# Patient Record
Sex: Male | Born: 1935 | Race: Black or African American | Hispanic: No | Marital: Married | Smoking: Never smoker
Health system: Southern US, Community
[De-identification: ages and names within clinical notes are randomized; demographics above are authoritative.]

---

## 2007-06-11 ENCOUNTER — Other Ambulatory Visit: Payer: Self-pay

## 2007-06-11 ENCOUNTER — Emergency Department: Payer: Self-pay | Admitting: Emergency Medicine

## 2008-10-03 IMAGING — NM NM LUNG SCAN
2 series · 16 of 16 positions shown · non-contrast
Comparison: none

REASON FOR EXAM: + d dimer
COMMENTS:

[Series 1000: lung perfusion · 1.95mm/px · 4 acquisitions, 8 frames shown]
[im 1/4]
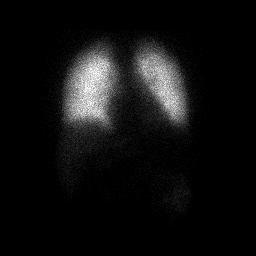
[im 1/4]
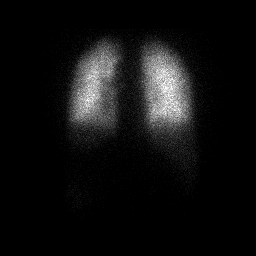
[im 2/4]
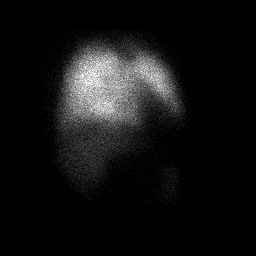
[im 2/4]
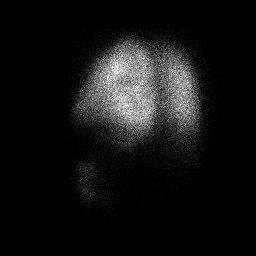
[im 3/4]
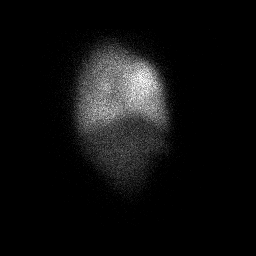
[im 3/4]
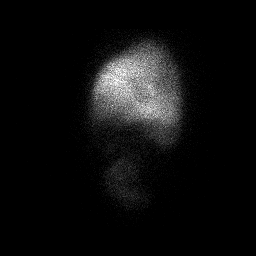
[im 4/4]
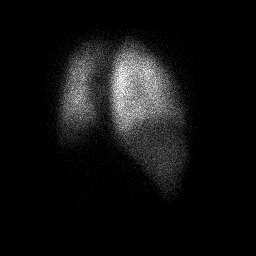
[im 4/4]
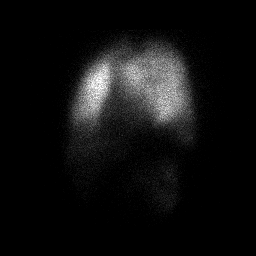

[Series 1000: lung ventilation · 3.90mm/px · 4 acquisitions, 8 frames shown]
[im 1/4]
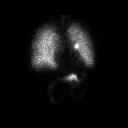
[im 1/4]
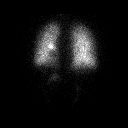
[im 2/4]
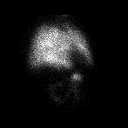
[im 2/4]
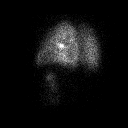
[im 3/4]
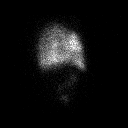
[im 3/4]
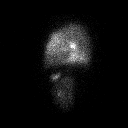
[im 4/4]
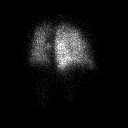
[im 4/4]
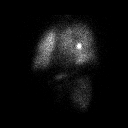

[16 of 16 positions shown; findings below may reference images not displayed]

PROCEDURE:     NM  - NM VQ LUNG SCAN  - [DATE]  [DATE] [DATE]  [DATE]

RESULT:     Comparison: Chest radiographs on 06/11/2007.

Procedure:
Patient inhaled aerosolized Tc 99m DTPA from a 38.37 mCi reservoir for
ventilation imaging. Multiple views were obtained. Subsequently, 4.4 mCi Tc
99m MAA was injected intravenously for the perfusion imaging. Multiple views
were obtained.
FINDINGS: Comparison chest radiograph from 06/11/2007 showed no significant pulmonary
consolidation or pleural effusion.

No significant perfusion defect. Low probability for pulmonary embolism.
IMPRESSION: 1. Low probability for pulmonary embolism.

## 2013-06-11 DIAGNOSIS — R739 Hyperglycemia, unspecified: Secondary | ICD-10-CM | POA: Insufficient documentation

## 2013-06-16 DIAGNOSIS — E119 Type 2 diabetes mellitus without complications: Secondary | ICD-10-CM | POA: Insufficient documentation

## 2013-06-16 DIAGNOSIS — N183 Chronic kidney disease, stage 3 unspecified: Secondary | ICD-10-CM | POA: Insufficient documentation

## 2013-06-16 DIAGNOSIS — I10 Essential (primary) hypertension: Secondary | ICD-10-CM | POA: Insufficient documentation

## 2013-06-16 DIAGNOSIS — F0390 Unspecified dementia without behavioral disturbance: Secondary | ICD-10-CM | POA: Insufficient documentation

## 2013-06-16 DIAGNOSIS — R001 Bradycardia, unspecified: Secondary | ICD-10-CM | POA: Insufficient documentation

## 2013-06-19 DIAGNOSIS — E1100 Type 2 diabetes mellitus with hyperosmolarity without nonketotic hyperglycemic-hyperosmolar coma (NKHHC): Secondary | ICD-10-CM | POA: Insufficient documentation

## 2014-04-26 DIAGNOSIS — I493 Ventricular premature depolarization: Secondary | ICD-10-CM | POA: Insufficient documentation

## 2014-06-05 DIAGNOSIS — I5030 Unspecified diastolic (congestive) heart failure: Secondary | ICD-10-CM | POA: Insufficient documentation

## 2016-11-30 ENCOUNTER — Ambulatory Visit: Payer: Medicare Other | Admitting: Podiatry

## 2016-12-10 ENCOUNTER — Ambulatory Visit (INDEPENDENT_AMBULATORY_CARE_PROVIDER_SITE_OTHER): Payer: Medicare Other | Admitting: Podiatry

## 2016-12-10 ENCOUNTER — Encounter: Payer: Self-pay | Admitting: Podiatry

## 2016-12-10 VITALS — BP 202/84 | HR 57

## 2016-12-10 DIAGNOSIS — M79675 Pain in left toe(s): Secondary | ICD-10-CM

## 2016-12-10 DIAGNOSIS — B351 Tinea unguium: Secondary | ICD-10-CM

## 2016-12-10 DIAGNOSIS — E1142 Type 2 diabetes mellitus with diabetic polyneuropathy: Secondary | ICD-10-CM

## 2016-12-10 DIAGNOSIS — M79674 Pain in right toe(s): Secondary | ICD-10-CM | POA: Diagnosis not present

## 2016-12-10 NOTE — Progress Notes (Signed)
   Subjective:    Patient ID: Trevor ShihGeorge L Fields, male    DOB: 04/19/1922, 83 y.o.   MRN: [id number]  HPI this patient presents the office with chief complaint of long thick painful nails.  This patient is brought in by his daughter for treatment of his nails.  His daughter says he has not had his nails worked on for over a year.  She says that the big toenail on the right foot came off but is not sure when the nail came off.  This patient is diabetic taking insulin.  He presents the office today for an evaluation and treatment of the long thick painful nails    Review of Systems  All other systems reviewed and are negative.      Objective:   Physical Exam GENERAL APPEARANCE: Alert, conversant. Appropriately groomed. No acute distress.  VASCULAR: Pedal pulses are  palpable at  [facility].  Capillary refill time is immediate to all digits,  Normal temperature gradient.  Digital hair growth is present bilateral  NEUROLOGIC: sensation is absent  to 5.07 monofilament at 5/5 sites bilateral.  Light touch is absent  bilateral, Muscle strength normal.  MUSCULOSKELETAL: acceptable muscle strength, tone and stability bilateral.  Intrinsic muscluature intact bilateral.  Rectus appearance of foot and digits noted bilateral.  NAILS  thick disfigured discolored nails with subungual debris noted.  No evidence of bacterial infection or drainage.  His right hallux nail has self avulsed and there is no evidence of any redness, swelling or infection. DERMATOLOGIC: skin color, texture, and turgor are within normal limits.  No preulcerative lesions or ulcers  are seen, no interdigital maceration noted.  No open lesions present.  Digital nails are asymptomatic. No drainage noted.         Assessment & Plan:  Onychomycosis  B/L  Diabetes with neuropathy.   IE  Debridement of mycotic nails.  Healing right hallyx nailbed.  RTC 10 weeks.   Helane GuntherGregory Brynn Mulgrew DPM

## 2017-03-11 ENCOUNTER — Encounter: Payer: Self-pay | Admitting: Podiatry

## 2017-03-11 ENCOUNTER — Ambulatory Visit (INDEPENDENT_AMBULATORY_CARE_PROVIDER_SITE_OTHER): Payer: Medicare Other | Admitting: Podiatry

## 2017-03-11 DIAGNOSIS — B351 Tinea unguium: Secondary | ICD-10-CM | POA: Diagnosis not present

## 2017-03-11 DIAGNOSIS — M79675 Pain in left toe(s): Secondary | ICD-10-CM

## 2017-03-11 DIAGNOSIS — M79674 Pain in right toe(s): Secondary | ICD-10-CM

## 2017-03-11 DIAGNOSIS — E1142 Type 2 diabetes mellitus with diabetic polyneuropathy: Secondary | ICD-10-CM

## 2017-03-11 NOTE — Progress Notes (Signed)
Complaint:  Visit Type: Patient returns to my office for continued preventative foot care services. Complaint: Patient states" my nails have grown long and thick and become painful to walk and wear shoes" Patient has been diagnosed with DM with no foot complications. The patient presents for preventative foot care services. No changes to ROS  Podiatric Exam: Vascular: dorsalis pedis and posterior tibial pulses are palpable bilateral. Capillary return is delayed.  Cold feet noted. Skin turgor WNL  Sensorium: Absent  Semmes Weinstein monofilament test. Normal tactile sensation bilaterally. Nail Exam: Pt has thick disfigured discolored nails with subungual debris noted bilateral entire nail hallux through fifth toenails.  Right hallux toenail regrowing. Ulcer Exam: There is no evidence of ulcer or pre-ulcerative changes or infection. Orthopedic Exam: Muscle tone and strength are WNL. No limitations in general ROM. No crepitus or effusions noted. Foot type and digits show no abnormalities. Bony prominences are unremarkable. Skin: No Porokeratosis. No infection or ulcers  Diagnosis:  Onychomycosis, , Pain in right toe, pain in left toes  Treatment & Plan Procedures and Treatment: Consent by patient was obtained for treatment procedures.   Debridement of mycotic and hypertrophic toenails, 1 through 5 bilateral and clearing of subungual debris. No ulceration, no infection noted.  Return Visit-Office Procedure: Patient instructed to return to the office for a follow up visit 3 months for continued evaluation and treatment.    Helane GuntherGregory Lashica Hannay DPM

## 2017-05-20 ENCOUNTER — Ambulatory Visit (INDEPENDENT_AMBULATORY_CARE_PROVIDER_SITE_OTHER): Payer: Medicare Other | Admitting: Podiatry

## 2017-05-20 ENCOUNTER — Encounter: Payer: Self-pay | Admitting: Podiatry

## 2017-05-20 DIAGNOSIS — B351 Tinea unguium: Secondary | ICD-10-CM | POA: Diagnosis not present

## 2017-05-20 DIAGNOSIS — E1142 Type 2 diabetes mellitus with diabetic polyneuropathy: Secondary | ICD-10-CM

## 2017-05-20 DIAGNOSIS — M79674 Pain in right toe(s): Secondary | ICD-10-CM

## 2017-05-20 DIAGNOSIS — M79675 Pain in left toe(s): Secondary | ICD-10-CM | POA: Diagnosis not present

## 2017-05-20 NOTE — Progress Notes (Addendum)
Complaint:  Visit Type: Patient returns to my office for continued preventative foot care services. Complaint: Patient states" my nails have grown long and thick and become painful to walk and wear shoes" Patient has been diagnosed with DM with no foot complications. The patient presents for preventative foot care services. No changes to ROS  Podiatric Exam: Vascular: dorsalis pedis and posterior tibial pulses are palpable bilateral. Capillary return is delayed.  Cold feet noted. Skin turgor WNL  Sensorium: Absent  Semmes Weinstein monofilament test. Normal tactile sensation bilaterally. Nail Exam: Pt has thick disfigured discolored nails with subungual debris noted bilateral entire nail hallux through fifth toenails.  Right hallux toenail regrowing. Ulcer Exam: There is no evidence of ulcer or pre-ulcerative changes or infection. Orthopedic Exam: Muscle tone and strength are WNL. No limitations in general ROM. No crepitus or effusions noted. Foot type and digits show no abnormalities. Bony prominences are unremarkable. Skin: No Porokeratosis. No infection or ulcers  Diagnosis:  Onychomycosis, , Pain in right toe, pain in left toes  Treatment & Plan Procedures and Treatment: Consent by patient was obtained for treatment procedures.   Debridement of mycotic and hypertrophic toenails, 1 through 5 bilateral and clearing of subungual debris. No ulceration, no infection noted. ABN signed for 2019. Return Visit-Office Procedure: Patient instructed to return to the office for a follow up visit 3 months for continued evaluation and treatment.    Helane GuntherGregory Kadarious Dikes DPM

## 2017-07-29 ENCOUNTER — Ambulatory Visit: Payer: Medicare Other | Admitting: Podiatry

## 2017-08-05 ENCOUNTER — Encounter: Payer: Self-pay | Admitting: Podiatry

## 2017-08-05 ENCOUNTER — Ambulatory Visit (INDEPENDENT_AMBULATORY_CARE_PROVIDER_SITE_OTHER): Payer: Medicare Other | Admitting: Podiatry

## 2017-08-05 DIAGNOSIS — B351 Tinea unguium: Secondary | ICD-10-CM

## 2017-08-05 DIAGNOSIS — M79674 Pain in right toe(s): Secondary | ICD-10-CM

## 2017-08-05 DIAGNOSIS — M79675 Pain in left toe(s): Secondary | ICD-10-CM | POA: Diagnosis not present

## 2017-08-05 DIAGNOSIS — E1142 Type 2 diabetes mellitus with diabetic polyneuropathy: Secondary | ICD-10-CM

## 2017-08-05 NOTE — Progress Notes (Signed)
Complaint:  Visit Type: Patient returns to my office for continued preventative foot care services. Complaint: Patient states" my nails have grown long and thick and become painful to walk and wear shoes" Patient has been diagnosed with DM with no foot complications. The patient presents for preventative foot care services. No changes to ROS  Podiatric Exam: Vascular: dorsalis pedis and posterior tibial pulses are palpable bilateral. Capillary return is delayed.  Cold feet noted. Skin turgor WNL  Sensorium: Absent  Semmes Weinstein monofilament test. Normal tactile sensation bilaterally. Nail Exam: Pt has thick disfigured discolored nails with subungual debris noted bilateral entire nail hallux through fifth toenails.  Right hallux toenail regrowing. Ulcer Exam: There is no evidence of ulcer or pre-ulcerative changes or infection. Orthopedic Exam: Muscle tone and strength are WNL. No limitations in general ROM. No crepitus or effusions noted. Foot type and digits show no abnormalities. Bony prominences are unremarkable. Skin: No Porokeratosis. No infection or ulcers  Diagnosis:  Onychomycosis, , Pain in right toe, pain in left toes  Treatment & Plan Procedures and Treatment: Consent by patient was obtained for treatment procedures.   Debridement of mycotic and hypertrophic toenails, 1 through 5 bilateral and clearing of subungual debris. No ulceration, no infection noted. ABN signed for 2019. Return Visit-Office Procedure: Patient instructed to return to the office for a follow up visit 3 months for continued evaluation and treatment.    Helane Gunther DPM

## 2017-10-18 ENCOUNTER — Ambulatory Visit (INDEPENDENT_AMBULATORY_CARE_PROVIDER_SITE_OTHER): Payer: Medicare Other | Admitting: Podiatry

## 2017-10-18 ENCOUNTER — Encounter: Payer: Self-pay | Admitting: Podiatry

## 2017-10-18 DIAGNOSIS — B351 Tinea unguium: Secondary | ICD-10-CM | POA: Diagnosis not present

## 2017-10-18 DIAGNOSIS — M79674 Pain in right toe(s): Secondary | ICD-10-CM

## 2017-10-18 DIAGNOSIS — M79675 Pain in left toe(s): Secondary | ICD-10-CM | POA: Diagnosis not present

## 2017-10-18 DIAGNOSIS — E1142 Type 2 diabetes mellitus with diabetic polyneuropathy: Secondary | ICD-10-CM

## 2017-10-18 NOTE — Progress Notes (Signed)
Complaint:  Visit Type: Patient returns to my office for continued preventative foot care services. Complaint: Patient states" my nails have grown long and thick and become painful to walk and wear shoes" Patient has been diagnosed with DM with no foot complications. The patient presents for preventative foot care services. No changes to ROS  Podiatric Exam: Vascular: dorsalis pedis and posterior tibial pulses are palpable bilateral. Capillary return is delayed.  Cold feet noted. Skin turgor WNL  Sensorium: Absent  Semmes Weinstein monofilament test. Normal tactile sensation bilaterally. Nail Exam: Pt has thick disfigured discolored nails with subungual debris noted bilateral entire nail hallux through fifth toenails.  Right hallux toenail regrowing. Ulcer Exam: There is no evidence of ulcer or pre-ulcerative changes or infection. Orthopedic Exam: Muscle tone and strength are WNL. No limitations in general ROM. No crepitus or effusions noted. Foot type and digits show no abnormalities. Bony prominences are unremarkable. Skin: No Porokeratosis. No infection or ulcers  Diagnosis:  Onychomycosis, , Pain in right toe, pain in left toes  Treatment & Plan Procedures and Treatment: Consent by patient was obtained for treatment procedures.   Debridement of mycotic and hypertrophic toenails, 1 through 5 bilateral and clearing of subungual debris. No ulceration, no infection noted. ABN signed for 2019. Return Visit-Office Procedure: Patient instructed to return to the office for a follow up visit 3 months for continued evaluation and treatment.    Helane GuntherGregory Thaddeus Evitts DPM

## 2017-12-30 ENCOUNTER — Ambulatory Visit: Payer: Medicare Other | Admitting: Podiatry

## 2018-04-06 DEATH — deceased
